# Patient Record
Sex: Male | Born: 2000 | Race: Black or African American | Hispanic: No | Marital: Single | Smoking: Never smoker
Health system: Southern US, Community
[De-identification: ages and names within clinical notes are randomized; demographics above are authoritative.]

## PROBLEM LIST (undated history)

## (undated) DIAGNOSIS — T7840XA Allergy, unspecified, initial encounter: Secondary | ICD-10-CM

## (undated) HISTORY — DX: Allergy, unspecified, initial encounter: T78.40XA

## (undated) HISTORY — PX: NO PAST SURGERIES: SHX2092

---
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
## 2023-06-15 ENCOUNTER — Ambulatory Visit (INDEPENDENT_AMBULATORY_CARE_PROVIDER_SITE_OTHER): Admitting: Family Medicine

## 2023-06-15 ENCOUNTER — Encounter: Payer: Self-pay | Admitting: Family Medicine

## 2023-06-15 VITALS — BP 110/64 | HR 74 | Temp 97.9°F | Ht 72.75 in | Wt 195.4 lb

## 2023-06-15 DIAGNOSIS — Z91018 Allergy to other foods: Secondary | ICD-10-CM | POA: Insufficient documentation

## 2023-06-15 DIAGNOSIS — J309 Allergic rhinitis, unspecified: Secondary | ICD-10-CM | POA: Insufficient documentation

## 2023-06-15 DIAGNOSIS — J301 Allergic rhinitis due to pollen: Secondary | ICD-10-CM | POA: Diagnosis not present

## 2023-06-15 DIAGNOSIS — R5381 Other malaise: Secondary | ICD-10-CM | POA: Insufficient documentation

## 2023-06-15 DIAGNOSIS — R21 Rash and other nonspecific skin eruption: Secondary | ICD-10-CM | POA: Insufficient documentation

## 2023-06-15 DIAGNOSIS — L700 Acne vulgaris: Secondary | ICD-10-CM

## 2023-06-15 DIAGNOSIS — L709 Acne, unspecified: Secondary | ICD-10-CM | POA: Insufficient documentation

## 2023-06-15 MED ORDER — TRIAMCINOLONE ACETONIDE 0.1 % EX CREA: 1.0000 | TOPICAL_CREAM | Freq: Two times a day (BID) | CUTANEOUS | 0 refills | Status: AC

## 2023-06-15 MED ORDER — ALBUTEROL SULFATE HFA 108 (90 BASE) MCG/ACT IN AERS: 2.0000 | INHALATION_SPRAY | Freq: Four times a day (QID) | RESPIRATORY_TRACT | 2 refills | Status: AC | PRN

## 2023-06-15 NOTE — Assessment & Plan Note (Signed)
 Some nuts produce nausea Occational throat itching No rash or wheezing or shortness of breath

## 2023-06-15 NOTE — Patient Instructions (Addendum)
 We may want to consider HPV vaccine if you have not had it  We will double check with [place] [facility] [place] peds  Get back to regular conditioning exercise - take it gradually If your fitness does not improve let us  know  I will send in albuterol inhaler for pre exercise if needed   Ask your mom about your dad's history  I would like to know if he or your mom have sickle cell disease or trait    Schedule an annual exam for the fall  You can do labs that day  Try not to eat heavy that day or - just water for 4 hours before visit (so cholesterol numbers are accurate)   Keep rash area on neck clean with soap and water (go back to dove soap)  Try the triamcinolone cream as needed  If not improving in 1-2 weeks let us  know

## 2023-06-15 NOTE — Assessment & Plan Note (Addendum)
 After a year-pt has started back playing BB Notes he gets out of breath quicker  Discussed gradual conditioning  If not improved let us  know   Did refill albuterol for pre exercise also   No know [place] in self or family  No cp

## 2023-06-15 NOTE — Assessment & Plan Note (Addendum)
 Quarter size area of scale and hyperpigmentation on R posterior neck without central clearing  No other rash areas   Per pt- topical antifungal did not help  ? If nummular eczema (history of eczema as child)  Will try triamcinolone cream 0.1 %  Instructed to call if not improved in 7-14 days   Encouraged to change body cleanser to dove for sensitive skin
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
## 2023-06-15 NOTE — Assessment & Plan Note (Signed)
 Seasonal  Worse in spring Getting over season now / improved   Uses prn claritin    Did have eczema as a child No severe asthma but has used inhaler for exercise in past

## 2023-06-15 NOTE — Assessment & Plan Note (Signed)
 Mild facial  Forehead  Clogged pores/no pustules or inflammatory findings  Discussed cleansing  May try gentle cleanser with benzoyl peroxide (face only) -like cereve  Update if not helpful Reminded to use sunscreen as well

## 2023-06-16 ENCOUNTER — Encounter: Payer: Self-pay | Admitting: Family Medicine

## 2023-10-19 ENCOUNTER — Ambulatory Visit: Admitting: Family Medicine

## 2023-10-19 VITALS — BP 118/70 | HR 71 | Temp 97.7°F | Ht 72.25 in | Wt 175.1 lb

## 2023-10-19 DIAGNOSIS — Z0000 Encounter for general adult medical examination without abnormal findings: Secondary | ICD-10-CM

## 2023-10-19 DIAGNOSIS — Z113 Encounter for screening for infections with a predominantly sexual mode of transmission: Secondary | ICD-10-CM | POA: Diagnosis not present

## 2023-10-19 NOTE — Patient Instructions (Addendum)
 Ask family if you had the HPV vaccine  It prevents a sexually transmitted   Labs for  Wellness and std screen today   Keep exercising  Eat a balanced diet

## 2023-10-19 NOTE — Progress Notes (Signed)
 Subjective:    Patient ID: Nicholas Booker, male    DOB: 16-Mar-2000, 23 y.o.   MRN: [id number]  HPI  Here for health maintenance exam and to review chronic medical problems   Wt Readings from Last 3 Encounters:  10/19/23 175 lb 2 oz (79.4 kg)  06/15/23 195 lb 6 oz (88.6 kg)  10/31/13 109 lb (49.4 kg) (86%, Z= 1.09)*   * Growth percentiles are based on CDC (Boys, 2-20 Years) data.   23.59 kg/m  Vitals:   10/19/23 1524 10/19/23 1547  BP: (!) 148/70 118/70  Pulse: 71   Temp: 97.7 F (36.5 C)   SpO2: 97%     Immunization History  Administered Date(s) Administered   DTaP 05/01/2002, 07/23/2002, 07/28/2003, 08/18/2005, 09/18/2006   HIB (PRP-OMP) 05/01/2002, 07/23/2002, 05/20/2003, 11/24/2004   Hepatitis A, Ped/Adol-2 Dose 09/18/2006, 03/29/2009   Hepatitis B, PED/ADOLESCENT 05/01/2002, 07/23/2002, 11/24/2004   IPV 05/01/2002, 07/23/2002, 07/28/2003, 09/18/2006   MMR 05/20/2003, 09/18/2006   MenQuadfi_Meningococcal Groups ACYW Conjugate 04/09/2014, 10/04/2018   Meningococcal B Recombinant 10/15/2019, 11/14/2019   Pneumococcal Conjugate-13 05/01/2002, 11/25/2002, 11/24/2004   Tdap 04/09/2014   Varicella 07/28/2003, 09/18/2006    Health Maintenance Due  Topic Date Due   HPV VACCINES (1 - Male 3-dose series) Never done   HIV Screening  Never done   Hepatitis C Screening  Never done   Feeling good  Taking care of himself   Long hours   Flu shot - declines   STI screening -interested   HPV vaccine -thinks he had   Family history - does not know a lot  GF cva  M HTN    Colon cancer screening - no fam history  No prostate cancer in family known   Bone health   Falls- none  Fractures-none recent  In past -fractured wrist , foot  Supplements -sometimes vitamin D , elderberry or sea moss   Eats a balanced diet  Good protein    Exercise  Gym - regular  Walks on breaks for 20 minutes  Active with his daughter (11 year old)     Mood    06/15/2023    10:18 AM  Depression screen PHQ 2/9  Decreased Interest 0  Down, Depressed, Hopeless 0  PHQ - 2 Score 0  Altered sleeping 0  Tired, decreased energy 0  Change in appetite 0  Feeling bad or failure about yourself  0  Trouble concentrating 0  Moving slowly or fidgety/restless 0  Suicidal thoughts 0  PHQ-9 Score 0  Difficult doing work/chores Not difficult at all       Patient Active Problem List   Diagnosis Date Noted   Routine general medical examination at a health care facility 10/19/2023   Screening examination for STD (sexually transmitted disease) 10/19/2023   Allergic rhinitis 06/15/2023   Rash of neck 06/15/2023   Acne 06/15/2023   Physical deconditioning 06/15/2023   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 No pertinent family history. Allergies  Allergen Reactions   Other     ALL NUTS: NAUSEA, THROAT ITCHING    Current  Outpatient Medications on File Prior to Visit  Medication Sig Dispense Refill   albuterol  (VENTOLIN  HFA) 108 (90 Base) MCG/ACT inhaler Inhale 2 puffs into the lungs every 6 (six) hours as needed for wheezing or shortness of breath (or before exercise). 8 g 2   triamcinolone  cream (KENALOG ) 0.1 % Apply 1 Application topically 2 (two) times daily. On affected area/neck rash 30 g 0   No current facility-administered medications on file prior to visit.    Review of Systems  Constitutional:  Negative for activity change, appetite change, fatigue, fever and unexpected weight change.  HENT:  Negative for congestion, rhinorrhea, sore throat and trouble swallowing.   Eyes:  Negative for pain, redness, itching and visual disturbance.   Respiratory:  Negative for cough, chest tightness, shortness of breath and wheezing.   Cardiovascular:  Negative for chest pain and palpitations.  Gastrointestinal:  Negative for abdominal pain, blood in stool, constipation, diarrhea and nausea.  Endocrine: Negative for cold intolerance, heat intolerance, polydipsia and polyuria.  Genitourinary:  Negative for difficulty urinating, dysuria, frequency and urgency.  Musculoskeletal:  Negative for arthralgias, joint swelling and myalgias.  Skin:  Negative for pallor and rash.  Neurological:  Negative for dizziness, tremors, weakness, numbness and headaches.  Hematological:  Negative for adenopathy. Does not bruise/bleed easily.  Psychiatric/Behavioral:  Negative for decreased concentration and dysphoric mood. The patient is not nervous/anxious.        Has fear of needles       Objective:   Physical Exam Constitutional:      General: He is not in acute distress.    Appearance: Normal appearance. He is well-developed and normal weight. He is not ill-appearing or diaphoretic.  HENT:     Head: Normocephalic and atraumatic.     Right Ear: Tympanic membrane, ear canal and external ear normal.     Left Ear: Tympanic membrane, ear canal and external ear normal.     Nose: Nose normal. No congestion.     Mouth/Throat:     Mouth: Mucous membranes are moist.     Pharynx: Oropharynx is clear. No posterior oropharyngeal erythema.  Eyes:     General: No scleral icterus.       Right eye: No discharge.        Left eye: No discharge.     Conjunctiva/sclera: Conjunctivae normal.     Pupils: Pupils are equal, round, and reactive to light.  Neck:     Thyroid: No thyromegaly.     Vascular: No carotid bruit or JVD.  Cardiovascular:     Rate and Rhythm: Normal rate and regular rhythm.     Pulses: Normal pulses.     Heart sounds: Normal heart sounds.     No gallop.  Pulmonary:     Effort: Pulmonary effort is normal. No respiratory distress.      Breath sounds: Normal breath sounds. No wheezing or rales.     Comments: Good air exch Chest:     Chest wall: No tenderness.  Abdominal:     General: Bowel sounds are normal. There is no distension or abdominal bruit.     Palpations: Abdomen is soft. There is no mass.     Tenderness: There is no abdominal tenderness.     Hernia: No hernia is present.  Musculoskeletal:        General: No tenderness.     Cervical back: Normal range of motion and neck supple. No rigidity. No muscular tenderness.     Right lower  leg: No edema.     Left lower leg: No edema.     Comments: No scoliosis or kyphosis   Lymphadenopathy:     Cervical: No cervical adenopathy.  Skin:    General: Skin is warm and dry.     Coloration: Skin is not pale.     Findings: No erythema or rash.  Neurological:     Mental Status: He is alert.     Cranial Nerves: No cranial nerve deficit.     Motor: No abnormal muscle tone.     Coordination: Coordination normal.     Gait: Gait normal.     Deep Tendon Reflexes: Reflexes are normal and symmetric. Reflexes normal.  Psychiatric:        Mood and Affect: Mood normal.        Cognition and Memory: Cognition and memory normal.     Comments: Pleasant            Assessment & Plan:   Problem List Items Addressed This Visit       Other   Screening examination for STD (sexually transmitted disease)   Wants to be screened  No symptoms No known exposures In monogamous relationship      Relevant Orders   C. trachomatis/N. gonorrhoeae RNA   Hepatitis C antibody   HIV Antibody (routine testing w rflx)   RPR   Routine general medical examination at a health care facility - Primary   Reviewed health habits including diet and exercise and skin cancer prevention Reviewed appropriate screening tests for age  Also reviewed health mt list, fam hx and immunization status , as well as social and family history   See HPI Labs reviewed and ordered Health Maintenance  Topic  Date Due   HPV Vaccine (1 - Male 3-dose series) Never done   HIV Screening  Never done   Hepatitis C Screening  Never done   Flu Shot  04/22/2024*   COVID-19 Vaccine (1 - 2024-25 season) 11/03/2024*   DTaP/Tdap/Td vaccine (7 - Td or Tdap) 04/08/2024   Pneumococcal Vaccine  Completed   Hepatitis B Vaccine  Completed   Meningitis B Vaccine  Discontinued  *Topic was postponed. The date shown is not the original due date.    Declines flu shot  STI screening today  Good lifestyle habits No cancer in family  Discussed fall prevention, supplements and exercise for bone density   PHQ 0  Reviewed imm record- some ? I he had HPV imm in past /given info on this to consider (will send to [facility] to make sure)   Wellness lab and STI screen today      Relevant Orders   TSH   Lipid panel   Comprehensive metabolic panel with GFR   CBC with Differential/Platelet

## 2023-10-19 NOTE — Assessment & Plan Note (Signed)
 Wants to be screened  No symptoms No known exposures In monogamous relationship

## 2023-10-19 NOTE — Assessment & Plan Note (Signed)
 Reviewed health habits including diet and exercise and skin cancer prevention Reviewed appropriate screening tests for age  Also reviewed health mt list, fam hx and immunization status , as well as social and family history   See HPI Labs reviewed and ordered Health Maintenance  Topic Date Due   HPV Vaccine (1 - Male 3-dose series) Never done   HIV Screening  Never done   Hepatitis C Screening  Never done   Flu Shot  04/22/2024*   COVID-19 Vaccine (1 - 2024-25 season) 11/03/2024*   DTaP/Tdap/Td vaccine (7 - Td or Tdap) 04/08/2024   Pneumococcal Vaccine  Completed   Hepatitis B Vaccine  Completed   Meningitis B Vaccine  Discontinued  *Topic was postponed. The date shown is not the original due date.    Declines flu shot  STI screening today  Good lifestyle habits No cancer in family  Discussed fall prevention, supplements and exercise for bone density   PHQ 0  Reviewed imm record- some ? I he had HPV imm in past /given info on this to consider (will send to [facility] to make sure)   Wellness lab and STI screen today

## 2023-10-20 ENCOUNTER — Ambulatory Visit: Payer: Self-pay | Admitting: Family Medicine

## 2023-10-22 LAB — CBC WITH DIFFERENTIAL/PLATELET
Absolute Lymphocytes: 1997 {cells}/uL (ref 850–3900)
Absolute Monocytes: 546 {cells}/uL (ref 200–950)
Basophils Absolute: 31 {cells}/uL (ref 0–200)
Basophils Relative: 0.6 %
Eosinophils Absolute: 31 {cells}/uL (ref 15–500)
Eosinophils Relative: 0.6 %
HCT: 41.2 % (ref 38.5–50.0)
Hemoglobin: 13.9 g/dL (ref 13.2–17.1)
MCH: 29.6 pg (ref 27.0–33.0)
MCHC: 33.7 g/dL (ref 32.0–36.0)
MCV: 87.8 fL (ref 80.0–100.0)
MPV: 10.6 fL (ref 7.5–12.5)
Monocytes Relative: 10.5 %
Neutro Abs: 2595 {cells}/uL (ref 1500–7800)
Neutrophils Relative %: 49.9 %
Platelets: 299 Thousand/uL (ref 140–400)
RBC: 4.69 Million/uL (ref 4.20–5.80)
RDW: 12.9 % (ref 11.0–15.0)
Total Lymphocyte: 38.4 %
WBC: 5.2 Thousand/uL (ref 3.8–10.8)

## 2023-10-22 LAB — COMPREHENSIVE METABOLIC PANEL WITH GFR
AG Ratio: 1.7 (calc) (ref 1.0–2.5)
ALT: 37 U/L (ref 9–46)
AST: 39 U/L (ref 10–40)
Albumin: 5.4 g/dL — ABNORMAL HIGH (ref 3.6–5.1)
Alkaline phosphatase (APISO): 51 U/L (ref 36–130)
BUN: 11 mg/dL (ref 7–25)
CO2: 29 mmol/L (ref 20–32)
Calcium: 10 mg/dL (ref 8.6–10.3)
Chloride: 98 mmol/L (ref 98–110)
Creat: 0.88 mg/dL (ref 0.60–1.24)
Globulin: 3.2 g/dL (ref 1.9–3.7)
Glucose, Bld: 98 mg/dL (ref 65–99)
Potassium: 3.7 mmol/L (ref 3.5–5.3)
Sodium: 138 mmol/L (ref 135–146)
Total Bilirubin: 1.1 mg/dL (ref 0.2–1.2)
Total Protein: 8.6 g/dL — ABNORMAL HIGH (ref 6.1–8.1)
eGFR: 125 mL/min/1.73m2 (ref >=60)

## 2023-10-22 LAB — LIPID PANEL
Cholesterol: 136 mg/dL (ref <200)
HDL: 58 mg/dL (ref >=40)
LDL Cholesterol (Calc): 63 mg/dL
Non-HDL Cholesterol (Calc): 78 mg/dL (ref <130)
Total CHOL/HDL Ratio: 2.3 (calc) (ref <5.0)
Triglycerides: 71 mg/dL (ref <150)

## 2023-10-22 LAB — HIV ANTIBODY (ROUTINE TESTING W REFLEX)
HIV 1&2 Ab, 4th Generation: NONREACTIVE
HIV FINAL INTERPRETATION: NEGATIVE

## 2023-10-22 LAB — RPR: RPR Ser Ql: NONREACTIVE

## 2023-10-22 LAB — TSH: TSH: 2.19 m[IU]/L (ref 0.40–4.50)

## 2023-10-22 LAB — C. TRACHOMATIS/N. GONORRHOEAE RNA
C. trachomatis RNA, TMA: NOT DETECTED
N. gonorrhoeae RNA, TMA: NOT DETECTED

## 2023-10-22 LAB — HEPATITIS C ANTIBODY: Hepatitis C Ab: NONREACTIVE

## 2024-10-20 ENCOUNTER — Encounter: Admitting: Family Medicine
